# Patient Record
Sex: Male | Born: 1945 | Race: Black or African American | Hispanic: No | Marital: Married | Smoking: Former smoker
Health system: Southern US, Community
[De-identification: ages and names within clinical notes are randomized; demographics above are authoritative.]

## PROBLEM LIST (undated history)

## (undated) DIAGNOSIS — C801 Malignant (primary) neoplasm, unspecified: Secondary | ICD-10-CM

## (undated) DIAGNOSIS — I10 Essential (primary) hypertension: Secondary | ICD-10-CM

## (undated) DIAGNOSIS — R7303 Prediabetes: Secondary | ICD-10-CM

---
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
## 2001-03-11 ENCOUNTER — Emergency Department (HOSPITAL_COMMUNITY): Admission: EM | Admit: 2001-03-11 | Discharge: 2001-03-11 | Payer: Self-pay | Admitting: Emergency Medicine

## 2015-07-27 DIAGNOSIS — C801 Malignant (primary) neoplasm, unspecified: Secondary | ICD-10-CM

## 2015-07-27 HISTORY — DX: Malignant (primary) neoplasm, unspecified: C80.1

## 2015-07-27 HISTORY — PX: PROSTATECTOMY: SHX69

## 2019-01-01 ENCOUNTER — Other Ambulatory Visit: Payer: Self-pay | Admitting: Family Medicine

## 2019-01-01 DIAGNOSIS — K4090 Unilateral inguinal hernia, without obstruction or gangrene, not specified as recurrent: Secondary | ICD-10-CM

## 2019-01-04 ENCOUNTER — Ambulatory Visit
Admission: RE | Admit: 2019-01-04 | Discharge: 2019-01-04 | Disposition: A | Discharge status: Home / Self Care | Payer: 59 | Source: Ambulatory Visit | Attending: Family Medicine | Admitting: Family Medicine

## 2019-01-04 ENCOUNTER — Other Ambulatory Visit: Payer: Self-pay

## 2019-01-04 DIAGNOSIS — K4090 Unilateral inguinal hernia, without obstruction or gangrene, not specified as recurrent: Secondary | ICD-10-CM | POA: Diagnosis not present

## 2019-01-11 ENCOUNTER — Ambulatory Visit: Payer: Self-pay | Admitting: General Surgery

## 2019-01-11 NOTE — H&P (Signed)
PATIENT PROFILE: Martin Coleman is a 73 y.o. male who presents to the Clinic for consultation at the request of Dr. Baldemar Lenis for evaluation of inguinal hernia.  PCP:  Barnabas Lister, MD  HISTORY OF PRESENT ILLNESS: Martin Coleman reports patient reports that he has been feeling a left inguinal hernia since a month ago.  He reports minimal pain or discomfort but he refers that it has been growing significantly in the last 2 or 3 months.  The increasing side is what made him ask his primary care physician.  His PCP did a physical exam suspected the inguinal hernia but due to the obesity of the patient I ordered ultrasound and confirmed the left inguinal hernia.  Patient denies any problem on the right inguinal area.  There is no pain radiation.  There is no alleviating factor.  Aggravating factor of the hernia sites is bowel movement with straining.  Denies abdominal distention, nausea or vomiting.   PROBLEM LIST:         Problem List  Date Reviewed: 01/01/2019         Noted   Essential hypertension, benign 09/28/2018   Seasonal allergies 09/28/2018   Pure hypercholesterolemia 09/28/2018   Elevated blood sugar level, unspecified 09/28/2018   Essential tremor 09/28/2018      GENERAL REVIEW OF SYSTEMS:   General ROS: negative for - chills, fatigue, fever, weight gain or weight loss Allergy and Immunology ROS: negative for - hives  Hematological and Lymphatic ROS: negative for - bleeding problems or bruising, negative for palpable nodes Endocrine ROS: negative for - heat or cold intolerance, hair changes Respiratory ROS: negative for - cough, shortness of breath or wheezing Cardiovascular ROS: no chest pain or palpitations GI ROS: negative for nausea, vomiting, abdominal pain, diarrhea, constipation Musculoskeletal ROS: negative for - joint swelling or muscle pain Neurological ROS: negative for - confusion, syncope Dermatological ROS: negative for pruritus and rash Psychiatric:  negative for anxiety, depression, difficulty sleeping and memory loss  MEDICATIONS: CurrentMedications        Current Outpatient Medications  Medication Sig Dispense Refill  . atorvastatin (LIPITOR) 10 MG tablet Take 1 tablet (10 mg total) by mouth once daily 90 tablet 1  . olmesartan (BENICAR) 20 MG tablet Take 1 tablet (20 mg total) by mouth once daily 90 tablet 3  . propranoloL (INDERAL) 20 MG tablet Take 1 tablet (20 mg total) by mouth 2 (two) times daily 180 tablet 1   No current facility-administered medications for this visit.       ALLERGIES: Patient has no known allergies.  PAST MEDICAL HISTORY:     Past Medical History:  Diagnosis Date  . Allergy   . Arthritis   . Asthma, unspecified asthma severity, unspecified whether complicated, unspecified whether persistent   . Diabetes mellitus without complication (CMS-HCC) 07/02/72  . Glaucoma (increased eye pressure)   . History of cancer    prostate  . Hyperlipidemia   . Hypertension     PAST SURGICAL HISTORY:      Past Surgical History:  Procedure Laterality Date  . PROSTATE SURGERY  2017     FAMILY HISTORY: Family History  Problem Relation Age of Onset  . Hyperlipidemia (Elevated cholesterol) Mother   . Stroke Mother   . Myocardial Infarction (Heart attack) Mother   . Prostate cancer Father   . Diabetes type II Sister   . Prostate cancer Maternal Grandfather      SOCIAL HISTORY: Social History  Socioeconomic History  . Marital status: Married    Spouse name: Not on file  . Number of children: Not on file  . Years of education: Not on file  . Highest education level: Not on file  Occupational History  . Not on file  Social Needs  . Financial resource strain: Not on file  . Food insecurity:    Worry: Not on file    Inability: Not on file  . Transportation needs:    Medical: Not on file    Non-medical: Not on file  Tobacco Use  . Smoking status:  Former Smoker    Types: Cigarettes    Last attempt to quit: 1995    Years since quitting: 25.4  . Smokeless tobacco: Never Used  Substance and Sexual Activity  . Alcohol use: Yes  . Drug use: Never  . Sexual activity: Yes    Partners: Female  Other Topics Concern  . Not on file  Social History Narrative  . Not on file      PHYSICAL EXAM:    Vitals:   01/11/19 1625  BP: 133/70  Pulse: 55   Body mass index is 32.79 kg/m. Weight: 86.6 kg (191 lb)   GENERAL: Alert, active, oriented x3  HEENT: Pupils equal reactive to light. Extraocular movements are intact. Sclera clear. Palpebral conjunctiva normal red color.Pharynx clear.  NECK: Supple with no palpable mass and no adenopathy.  LUNGS: Sound clear with no rales rhonchi or wheezes.  HEART: Regular rhythm S1 and S2 without murmur.  ABDOMEN: Soft and depressible, nontender with no palpable mass, no hepatomegaly.  Left inguinal hernia, soft, reducible.  EXTREMITIES: Well-developed well-nourished symmetrical with no dependent edema.  NEUROLOGICAL: Awake alert oriented, facial expression symmetrical, moving all extremities.  REVIEW OF DATA: I have reviewed the following data today:      Ancillary Orders on 12/26/2018  Component Date Value  . Glucose 12/26/2018 107   . Sodium 12/26/2018 141   . Potassium 12/26/2018 5.3*  . Chloride 12/26/2018 103   . Carbon Dioxide (CO2) 12/26/2018 32.4*  . Urea Nitrogen (BUN) 12/26/2018 22   . Creatinine 12/26/2018 0.9   . Glomerular Filtration Ra* 12/26/2018 101   . Calcium 12/26/2018 9.6   . AST  12/26/2018 20   . ALT  12/26/2018 14   . Alk Phos (alkaline Phosp* 12/26/2018 47   . Albumin 12/26/2018 4.0   . Bilirubin, Total 12/26/2018 0.3   . Protein, Total 12/26/2018 6.8   . A/G Ratio 12/26/2018 1.4   . Hemoglobin A1C 12/26/2018 6.8*  . Average Blood Glucose (C* 12/26/2018 148   . Cholesterol, Total 12/26/2018 246*  . Triglyceride 12/26/2018 105   .  HDL (High Density Lipopr* 12/26/2018 48.7   . LDL Calculated 12/26/2018 176*  . VLDL Cholesterol 12/26/2018 21   . Cholesterol/HDL Ratio 12/26/2018 5.1   . Thyroid Stimulating Horm* 12/26/2018 2.773   . PSA (Prostate Specific A* 12/26/2018 <0.01*  . WBC (White Blood Cell Co* 12/26/2018 5.9   . RBC (Red Blood Cell Coun* 12/26/2018 4.51*  . Hemoglobin 12/26/2018 13.7*  . Hematocrit 12/26/2018 43.9   . MCV (Mean Corpuscular Vo* 12/26/2018 97.3   . MCH (Mean Corpuscular He* 12/26/2018 30.4   . MCHC (Mean Corpuscular H* 12/26/2018 31.2*  . Platelet Count 12/26/2018 251   . RDW-CV (Red Cell Distrib* 12/26/2018 14.6   . MPV (Mean Platelet Volum* 12/26/2018 10.3   . Neutrophils 12/26/2018 3.23   . Lymphocytes 12/26/2018 1.55   .  Monocytes 12/26/2018 0.70   . Eosinophils 12/26/2018 0.39   . Basophils 12/26/2018 0.06   . Neutrophil % 12/26/2018 54.3   . Lymphocyte % 12/26/2018 26.1   . Monocyte % 12/26/2018 11.8   . Eosinophil % 12/26/2018 6.6*  . Basophil% 12/26/2018 1.0   . Immature Granulocyte % 12/26/2018 0.2   . Immature Granulocyte Cou* 12/26/2018 0.01      ASSESSMENT: Mr. Ellner is a 73 y.o. male presenting for consultation for left inguinal hernia.    The patient presents with a symptomatic, reducible left inguinal hernia. Patient was oriented about the diagnosis of inguinal hernia and its implication. The patient was oriented about the treatment alternatives (observation vs surgical repair). Due to patient symptoms, repair is recommended. Patient oriented about the surgical procedure, the use of mesh and its risk of complications such as: infection, bleeding, injury to vas deference, vasculature and testicle, injury to bowel or bladder, and chronic pain.  Non-recurrent unilateral inguinal hernia without obstruction or gangrene [K40.90]  PLAN: 1.  Left inguinal hernia repair with mesh (30149) 2.  CBC and CMP done December 26, 2018 3.  Avoid taking aspirin 5 days before the  surgery 4.  Contact us to coordinate surgery date 5.  You can call the office at any time you have any question or concern.  Patient verbalized understanding, all questions were answered, and were agreeable with the plan outlined above.    Herbert Pun, MD  Electronically signed by Herbert Pun, MD

## 2019-01-20 ENCOUNTER — Other Ambulatory Visit: Payer: Self-pay | Admitting: Family Medicine

## 2019-01-20 DIAGNOSIS — Z20822 Contact with and (suspected) exposure to covid-19: Secondary | ICD-10-CM

## 2019-01-26 LAB — NOVEL CORONAVIRUS, NAA: SARS-CoV-2, NAA: NOT DETECTED

## 2019-02-05 ENCOUNTER — Other Ambulatory Visit: Payer: Self-pay

## 2019-02-05 ENCOUNTER — Encounter
Admission: RE | Admit: 2019-02-05 | Discharge: 2019-02-05 | Disposition: A | Discharge status: Home / Self Care | Payer: 59 | Source: Ambulatory Visit | Attending: General Surgery | Admitting: General Surgery

## 2019-02-05 DIAGNOSIS — Z1159 Encounter for screening for other viral diseases: Secondary | ICD-10-CM | POA: Diagnosis not present

## 2019-02-05 DIAGNOSIS — I10 Essential (primary) hypertension: Secondary | ICD-10-CM | POA: Diagnosis not present

## 2019-02-05 DIAGNOSIS — Z01818 Encounter for other preprocedural examination: Secondary | ICD-10-CM | POA: Insufficient documentation

## 2019-02-05 HISTORY — DX: Essential (primary) hypertension: I10

## 2019-02-05 HISTORY — DX: Prediabetes: R73.03

## 2019-02-05 NOTE — Patient Instructions (Signed)
  Your procedure is scheduled on: Monday February 12, 2019 Report to Same Day Surgery 2nd floor [facility] Entrance-take elevator on left to 2nd floor.  Check in with surgery information desk.) To find out your arrival time, call [phone] 1:00-3:00 PM on Friday February 09, 2019  Drive through [place] testing on Thursday February 08, 2019 8-10:30 am [place] entrance [address]  Remember: Instructions that are not followed completely may result in serious medical risk, up to and including death, or upon the discretion of your surgeon and anesthesiologist your surgery may need to be rescheduled.    __x__ 1. Do not eat food (including mints, candies, chewing gum) after midnight the night before your procedure. You may drink water up to 2 hours before you are scheduled to arrive at the hospital for your procedure.  Do not drink anything within 2 hours of your scheduled arrival to the hospital.    __x__ 2. No Alcohol for 24 hours before or after surgery.   __x__ 3. No Smoking or e-cigarettes for 24 hours before surgery.  Do not use any chewable tobacco products for at least 6 hours before surgery.   __x__ 4. Notify your doctor if there is any change in your medical condition (cold, fever, infections).   __x__ 5. On the morning of surgery brush your teeth with toothpaste and water.  You may rinse your mouth with mouthwash if you wish.  Do not swallow any toothpaste or mouthwash.  Please read over the following fact sheets that you were given:   [facility] Preparing for Surgery and/or MRSA Information    __x__ Use CHG Soap as directed on instruction sheet.   Do not wear jewelry, lotions, powders, deodorant, or perfumes on the day of surgery.  Do not shave below the face/neck 48 hours prior to surgery.   Do not bring valuables to the hospital.    [facility] is not responsible for any belongings or valuables.               Contacts, dentures or bridgework may  not be worn into surgery.  For patients discharged on the day of surgery, you will NOT be permitted to drive yourself home.  You must have a responsible adult with you for 24 hours after surgery.  __x__ Take these medicines on the morning of surgery with a SMALL SIP OF WATER:  1. Propranolol  2. Atorvastatin   Skip your Olmesartan only on the morning of surgery.  __x__ Follow recommendations from Cardiologist, Pulmonologist or PCP regarding stopping Aspirin, Coumadin, Plavix, Eliquis, Effient, Pradaxa, and Pletal.  __x__ TODAY: Do not take Anti-inflammatories such as Advil, Ibuprofen, Motrin, Aleve, Naproxen, Naprosyn, BC/Goodies powders or aspirin products. You may continue to take Tylenol.   __x__ TODAY: Do not take over the counter supplements until after surgery. You may continue to take Vitamin D, Vitamin B, and multivitamin.

## 2019-02-05 NOTE — Pre-Procedure Instructions (Signed)
Ekg/request for clearance called and faxed to dr Baldemar Lenis. Also called and faxed info to dr Peyton Najjar diaz

## 2019-02-08 ENCOUNTER — Other Ambulatory Visit
Admission: RE | Admit: 2019-02-08 | Discharge: 2019-02-08 | Disposition: A | Discharge status: Home / Self Care | Payer: 59 | Source: Ambulatory Visit | Attending: General Surgery | Admitting: General Surgery

## 2019-02-08 ENCOUNTER — Other Ambulatory Visit: Payer: Self-pay

## 2019-02-08 DIAGNOSIS — Z01818 Encounter for other preprocedural examination: Secondary | ICD-10-CM | POA: Diagnosis not present

## 2019-02-08 LAB — SARS CORONAVIRUS 2 (TAT 6-24 HRS): SARS Coronavirus 2: NEGATIVE

## 2019-02-09 ENCOUNTER — Encounter: Payer: Self-pay | Admitting: Anesthesiology

## 2019-02-11 MED ORDER — CEFAZOLIN SODIUM-DEXTROSE 2-4 GM/100ML-% IV SOLN
2.0000 g | INTRAVENOUS | Status: AC
  Administered 2019-02-12: 2 g via INTRAVENOUS

## 2019-02-12 ENCOUNTER — Other Ambulatory Visit: Payer: Self-pay

## 2019-02-12 ENCOUNTER — Encounter
Admission: RE | Disposition: A | Discharge status: Home / Self Care | Payer: Self-pay | Source: Home / Self Care | Attending: General Surgery

## 2019-02-12 ENCOUNTER — Ambulatory Visit: Payer: No Typology Code available for payment source | Admitting: Anesthesiology

## 2019-02-12 ENCOUNTER — Ambulatory Visit
Admission: RE | Admit: 2019-02-12 | Discharge: 2019-02-12 | Disposition: A | Discharge status: Home / Self Care | Payer: No Typology Code available for payment source | Attending: General Surgery | Admitting: General Surgery

## 2019-02-12 ENCOUNTER — Encounter: Payer: Self-pay | Admitting: *Deleted

## 2019-02-12 DIAGNOSIS — Z79899 Other long term (current) drug therapy: Secondary | ICD-10-CM | POA: Diagnosis not present

## 2019-02-12 DIAGNOSIS — E669 Obesity, unspecified: Secondary | ICD-10-CM | POA: Insufficient documentation

## 2019-02-12 DIAGNOSIS — E785 Hyperlipidemia, unspecified: Secondary | ICD-10-CM | POA: Insufficient documentation

## 2019-02-12 DIAGNOSIS — Z8546 Personal history of malignant neoplasm of prostate: Secondary | ICD-10-CM | POA: Diagnosis not present

## 2019-02-12 DIAGNOSIS — E7800 Pure hypercholesterolemia, unspecified: Secondary | ICD-10-CM | POA: Diagnosis not present

## 2019-02-12 DIAGNOSIS — Z6830 Body mass index (BMI) 30.0-30.9, adult: Secondary | ICD-10-CM | POA: Diagnosis not present

## 2019-02-12 DIAGNOSIS — Z87891 Personal history of nicotine dependence: Secondary | ICD-10-CM | POA: Diagnosis not present

## 2019-02-12 DIAGNOSIS — E1165 Type 2 diabetes mellitus with hyperglycemia: Secondary | ICD-10-CM | POA: Diagnosis not present

## 2019-02-12 DIAGNOSIS — K4090 Unilateral inguinal hernia, without obstruction or gangrene, not specified as recurrent: Secondary | ICD-10-CM | POA: Insufficient documentation

## 2019-02-12 DIAGNOSIS — I10 Essential (primary) hypertension: Secondary | ICD-10-CM | POA: Insufficient documentation

## 2019-02-12 HISTORY — PX: INGUINAL HERNIA REPAIR: SHX194

## 2019-02-12 LAB — GLUCOSE, CAPILLARY
Glucose-Capillary: 105 mg/dL — ABNORMAL HIGH (ref 70–99)
Glucose-Capillary: 123 mg/dL — ABNORMAL HIGH (ref 70–99)

## 2019-02-12 SURGERY — REPAIR, HERNIA, INGUINAL, ADULT
Anesthesia: General | Laterality: Left

## 2019-02-12 MED ORDER — ROCURONIUM BROMIDE 100 MG/10ML IV SOLN
INTRAVENOUS | Status: DC | PRN
  Administered 2019-02-12: 10 mg via INTRAVENOUS
  Administered 2019-02-12: 30 mg via INTRAVENOUS
  Administered 2019-02-12: 10 mg via INTRAVENOUS

## 2019-02-12 MED ORDER — HYDROCODONE-ACETAMINOPHEN 5-325 MG PO TABS: 1.0000 | ORAL_TABLET | ORAL | 0 refills | Status: AC | PRN

## 2019-02-12 MED ORDER — PROPOFOL 10 MG/ML IV BOLUS
INTRAVENOUS | Status: DC | PRN
  Administered 2019-02-12: 20 mg via INTRAVENOUS
  Administered 2019-02-12: 150 mg via INTRAVENOUS
  Administered 2019-02-12: 30 mg via INTRAVENOUS

## 2019-02-12 MED ORDER — LIDOCAINE HCL (CARDIAC) PF 100 MG/5ML IV SOSY
PREFILLED_SYRINGE | INTRAVENOUS | Status: DC | PRN
  Administered 2019-02-12: 80 mg via INTRAVENOUS

## 2019-02-12 MED ORDER — BUPIVACAINE-EPINEPHRINE (PF) 0.25% -1:200000 IJ SOLN
INTRAMUSCULAR | Status: AC
  Filled 2019-02-12: qty 30

## 2019-02-12 MED ORDER — EPHEDRINE SULFATE 50 MG/ML IJ SOLN
INTRAMUSCULAR | Status: DC | PRN
  Administered 2019-02-12 (×2): 10 mg via INTRAVENOUS

## 2019-02-12 MED ORDER — FENTANYL CITRATE (PF) 100 MCG/2ML IJ SOLN
INTRAMUSCULAR | Status: AC
  Filled 2019-02-12: qty 2

## 2019-02-12 MED ORDER — DEXAMETHASONE SODIUM PHOSPHATE 10 MG/ML IJ SOLN
INTRAMUSCULAR | Status: DC | PRN
  Administered 2019-02-12: 10 mg via INTRAVENOUS

## 2019-02-12 MED ORDER — PROPOFOL 10 MG/ML IV BOLUS
INTRAVENOUS | Status: AC
  Filled 2019-02-12: qty 20

## 2019-02-12 MED ORDER — FAMOTIDINE 20 MG PO TABS
20.0000 mg | ORAL_TABLET | Freq: Once | ORAL | Status: AC
  Administered 2019-02-12: 20 mg via ORAL

## 2019-02-12 MED ORDER — BUPIVACAINE-EPINEPHRINE 0.25% -1:200000 IJ SOLN
INTRAMUSCULAR | Status: DC | PRN
  Administered 2019-02-12: 30 mL

## 2019-02-12 MED ORDER — GLYCOPYRROLATE 0.2 MG/ML IJ SOLN
INTRAMUSCULAR | Status: DC | PRN
  Administered 2019-02-12: 0.2 mg via INTRAVENOUS

## 2019-02-12 MED ORDER — ONDANSETRON HCL 4 MG/2ML IJ SOLN: 4.0000 mg | Freq: Once | INTRAMUSCULAR | Status: DC | PRN

## 2019-02-12 MED ORDER — FENTANYL CITRATE (PF) 100 MCG/2ML IJ SOLN: 25.0000 ug | INTRAMUSCULAR | Status: DC | PRN

## 2019-02-12 MED ORDER — FAMOTIDINE 20 MG PO TABS
ORAL_TABLET | ORAL | Status: AC
  Administered 2019-02-12: 20 mg via ORAL
  Filled 2019-02-12: qty 1

## 2019-02-12 MED ORDER — ONDANSETRON HCL 4 MG/2ML IJ SOLN
INTRAMUSCULAR | Status: DC | PRN
  Administered 2019-02-12: 4 mg via INTRAVENOUS

## 2019-02-12 MED ORDER — CEFAZOLIN SODIUM-DEXTROSE 2-4 GM/100ML-% IV SOLN
INTRAVENOUS | Status: AC
  Filled 2019-02-12: qty 100

## 2019-02-12 MED ORDER — FENTANYL CITRATE (PF) 100 MCG/2ML IJ SOLN
INTRAMUSCULAR | Status: DC | PRN
  Administered 2019-02-12: 25 ug via INTRAVENOUS
  Administered 2019-02-12: 50 ug via INTRAVENOUS
  Administered 2019-02-12: 25 ug via INTRAVENOUS

## 2019-02-12 MED ORDER — LACTATED RINGERS IV SOLN
INTRAVENOUS | Status: DC | PRN
  Administered 2019-02-12 (×2): via INTRAVENOUS

## 2019-02-12 MED ORDER — SODIUM CHLORIDE 0.9 % IV SOLN
INTRAVENOUS | Status: DC
  Administered 2019-02-12: 07:00:00 via INTRAVENOUS

## 2019-02-12 MED ORDER — SUGAMMADEX SODIUM 200 MG/2ML IV SOLN
INTRAVENOUS | Status: DC | PRN
  Administered 2019-02-12: 200 mg via INTRAVENOUS

## 2019-02-12 MED ORDER — SEVOFLURANE IN SOLN
RESPIRATORY_TRACT | Status: AC
  Filled 2019-02-12: qty 250

## 2019-02-12 SURGICAL SUPPLY — 33 items
ADH SKN CLS APL DERMABOND .7 (GAUZE/BANDAGES/DRESSINGS) ×1
APL PRP STRL LF DISP 70% ISPRP (MISCELLANEOUS) ×1
BLADE SURG 15 STRL LF DISP TIS (BLADE) ×1 IMPLANT
BLADE SURG 15 STRL SS (BLADE) ×3
CANISTER SUCT 1200ML W/VALVE (MISCELLANEOUS) ×3 IMPLANT
CHLORAPREP W/TINT 26 (MISCELLANEOUS) ×3 IMPLANT
COVER WAND RF STERILE (DRAPES) ×3 IMPLANT
DERMABOND ADVANCED (GAUZE/BANDAGES/DRESSINGS) ×2
DERMABOND ADVANCED .7 DNX12 (GAUZE/BANDAGES/DRESSINGS) ×1 IMPLANT
DRAIN PENROSE 1/4X12 LTX (DRAIN) ×3 IMPLANT
DRAPE LAPAROTOMY 100X77 ABD (DRAPES) ×3 IMPLANT
ELECT REM PT RETURN 9FT ADLT (ELECTROSURGICAL) ×3
ELECTRODE REM PT RTRN 9FT ADLT (ELECTROSURGICAL) ×1 IMPLANT
GAUZE 4X4 16PLY RFD (DISPOSABLE) ×2 IMPLANT
GLOVE BIO SURGEON STRL SZ 6.5 (GLOVE) ×4 IMPLANT
GLOVE BIO SURGEONS STRL SZ 6.5 (GLOVE) ×2
GLOVE INDICATOR 6.5 STRL GRN (GLOVE) ×3 IMPLANT
GOWN STRL REUS W/ TWL LRG LVL3 (GOWN DISPOSABLE) ×2 IMPLANT
GOWN STRL REUS W/TWL LRG LVL3 (GOWN DISPOSABLE) ×6
LABEL OR SOLS (LABEL) ×3 IMPLANT
MESH HERNIA 6X13 (Mesh General) ×2 IMPLANT
NEEDLE HYPO 22GX1.5 SAFETY (NEEDLE) ×3 IMPLANT
NS IRRIG 500ML POUR BTL (IV SOLUTION) ×3 IMPLANT
PACK BASIN MINOR ARMC (MISCELLANEOUS) ×3 IMPLANT
SUT MNCRL 4-0 (SUTURE) ×3
SUT MNCRL 4-0 27XMFL (SUTURE) ×1
SUT SURGILON 0 BLK (SUTURE) ×8 IMPLANT
SUT VIC AB 2-0 BRD 54 (SUTURE) ×3 IMPLANT
SUT VIC AB 2-0 CT2 27 (SUTURE) ×3 IMPLANT
SUT VIC AB 3-0 SH 27 (SUTURE) ×6
SUT VIC AB 3-0 SH 27X BRD (SUTURE) ×2 IMPLANT
SUTURE MNCRL 4-0 27XMF (SUTURE) ×1 IMPLANT
SYR 10ML LL (SYRINGE) ×3 IMPLANT

## 2019-02-12 NOTE — Anesthesia Postprocedure Evaluation (Signed)
Anesthesia Post Note  Patient: Martin Coleman  Procedure(s) Performed: LEFT OPEN HERNIA REPAIR INGUINAL ADULT (Left )  Patient location during evaluation: PACU Anesthesia Type: General Level of consciousness: awake and alert Pain management: pain level controlled Vital Signs Assessment: post-procedure vital signs reviewed and stable Respiratory status: spontaneous breathing, nonlabored ventilation, respiratory function stable and patient connected to nasal cannula oxygen Cardiovascular status: blood pressure returned to baseline and stable Postop Assessment: no apparent nausea or vomiting Anesthetic complications: no     Last Vitals:  Vitals:   02/12/19 1123 02/12/19 1152  BP: (!) 153/62 (!) 156/73  Pulse: 60 (!) 55  Resp: 16 16  Temp: (!) 36 C   SpO2: 95% 95%    Last Pain:  Vitals:   02/12/19 1152  TempSrc:   PainSc: [place]

## 2019-02-12 NOTE — Op Note (Signed)
Preoperative diagnosis: Left Inguinal Hernia.  Postoperative diagnosis: Left Indirect Inguinal Hernia.  Procedure: Left Inguinal hernia repair with mesh  Anesthesia: General  Surgeon: Dr. Windell Moment  Wound Classification: Clean  Indications:  Patient is a 73 y.o. male developed a symptomatic left inguinal hernia. Repair was indicated to avoid complications of incarceration, obstruction and pain, and a prosthetic mesh repair was elected.  Findings: 1. Vas Deferens and cord structures identified and preserved 2. An indirect inguinal hernia was identified 3. Pre shaped Large Hernia System used for repair 4. Adequate hemostasis achieved  Description of procedure: The patient was taken to the operating room. A time-out was completed verifying correct patient, procedure, site, positioning, and implant(s) and/or special equipment prior to beginning this procedure. General anesthesia was induced. The left groin was prepped and draped in the usual sterile fashion. An incision was marked in a natural skin crease and planned to end near the pubic tubercle.  The skin crease incision was made with a knife and deepened through Scarpa's and Camper's fascia with electrocautery until the aponeurosis of the external oblique was encountered. This was cleaned and the external ring was exposed. Hemostasis was achieved in the wound. An incision was made in the midportion of the external oblique aponeurosis in the direction of its fibers. The ilioinguinal nerve was identified and protected throughout the dissection. Flaps of the external oblique were developed cephalad and inferiorly.  The cord was identified. It was gently dissected free at the pubic tubercle and encircled with a Penrose drain. Attention was directed to the anteromedial aspect of the cord, where an indirect hernia sac was identified. The sac was carefully dissected free of the cord down to the level of the internal ring. The vas and testicular  vessels were identified and protected from harm. The sac was opened and contents were reduced. A finger was passed into the peritoneal cavity and the floor of the inguinal canal assessed and found to be strong. The femoral canal was palpated and no hernia identified. The sac was twisted and suture ligated with 2-0 silk. Redundant sac was excised and submitted to pathology. The stump of the sac was checked for hemostasis and allowed to retract into the abdomen.  Attention then turned to the floor of the canal, which appeared to be grossly weakened without a well-defined defect or sac. The Pre shaped large Hernia System mesh was inserted. Beginning at the pubic tubercle, the mesh was sutured to the inguinal ligament inferiorly and the conjoint tendon superiorly using interrupted 0 nonabsorbable sutures. Care was taken to assure that the mesh was placed in a relaxed fashion to avoid excessive tension and that no neurovascular structures were caught in the repair. Laterally, the tails of the mesh were crossed and the internal ring recreated.  Hemostasis was again checked. The Penrose drain was removed. The external oblique aponeurosis was closed with a running suture of 3-0 Vicryl, taking care not to catch the ilioinguinal nerve in the suture line. Scarpa's fascia was closed with interrupted 3-0 Vicryl.  The skin was closed with a subcuticular stitch of Monocryl 4-0. Dermabond was applied.  The testis was gently pulled down into its anatomic position in the scrotum.  The patient tolerated the procedure well and was taken to the postanesthesia care unit in stable condition.   Specimen: Hernia sac and cord lipoma  Complications: None  Estimated Blood Loss: 20 mL

## 2019-02-12 NOTE — H&P (Signed)
PATIENT PROFILE: Martin Coleman is a 73 y.o. male who presents to the the OR repair of left inguinal hernia.  PCP: Barnabas Lister, MD  HISTORY OF PRESENT ILLNESS: Martin Coleman was initially evaluated in my office for evaluation of left inguinal hernia. He reports patient reports that he has been feeling a left inguinal hernia since a month ago. He reports minimal pain or discomfort but he refers that it has been growing significantly in the last 2 or 3 months. The increasing side is what made him ask his primary care physician. His PCP did a physical exam suspected the inguinal hernia but due to the obesity of the patient I ordered ultrasound and confirmed the left inguinal hernia. Patient denies any problem on the right inguinal area. There is no pain radiation. There is no alleviating factor. Aggravating factor of the hernia sites is bowel movement with straining. Denies abdominal distention, nausea or vomiting.  There has been no changes in his history or physical exam since last evaluation.   PROBLEM LIST: Problem List Date Reviewed: 01/01/2019  Noted  Essential hypertension, benign 09/28/2018  Seasonal allergies 09/28/2018  Pure hypercholesterolemia 09/28/2018  Elevated blood sugar level, unspecified 09/28/2018  Essential tremor 09/28/2018    GENERAL REVIEW OF SYSTEMS:   General ROS: negative for - chills, fatigue, fever, weight gain or weight loss Allergy and Immunology ROS: negative for - hives  Hematological and Lymphatic ROS: negative for - bleeding problems or bruising, negative for palpable nodes Endocrine ROS: negative for - heat or cold intolerance, hair changes Respiratory ROS: negative for - cough, shortness of breath or wheezing Cardiovascular ROS: no chest pain or palpitations GI ROS: negative for nausea, vomiting, abdominal pain, diarrhea, constipation Musculoskeletal ROS: negative for - joint swelling or muscle pain Neurological ROS: negative for - confusion,  syncope Dermatological ROS: negative for pruritus and rash Psychiatric: negative for anxiety, depression, difficulty sleeping and memory loss  MEDICATIONS: Current Outpatient Medications  Medication Sig Dispense Refill  . atorvastatin (LIPITOR) 10 MG tablet Take 1 tablet (10 mg total) by mouth once daily 90 tablet 1  . olmesartan (BENICAR) 20 MG tablet Take 1 tablet (20 mg total) by mouth once daily 90 tablet 3  . propranoloL (INDERAL) 20 MG tablet Take 1 tablet (20 mg total) by mouth 2 (two) times daily 180 tablet 1   No current facility-administered medications for this visit.   ALLERGIES: Patient has no known allergies.  PAST MEDICAL HISTORY: Past Medical History:  Diagnosis Date  . Allergy  . Arthritis  . Asthma, unspecified asthma severity, unspecified whether complicated, unspecified whether persistent  . Diabetes mellitus without complication (CMS-HCC) 93/7/34  . Glaucoma (increased eye pressure)  . History of cancer  prostate  . Hyperlipidemia  . Hypertension   PAST SURGICAL HISTORY: Past Surgical History:  Procedure Laterality Date  . PROSTATE SURGERY 2017    FAMILY HISTORY: Family History  Problem Relation Age of Onset  . Hyperlipidemia (Elevated cholesterol) Mother  . Stroke Mother  . Myocardial Infarction (Heart attack) Mother  . Prostate cancer Father  . Diabetes type II Sister  . Prostate cancer Maternal Grandfather    SOCIAL HISTORY: Social History   Socioeconomic History  . Marital status: Married  Spouse name: Not on file  . Number of children: Not on file  . Years of education: Not on file  . Highest education level: Not on file  Occupational History  . Not on file  Social Needs  . Financial resource strain: Not  on file  . Food insecurity:  Worry: Not on file  Inability: Not on file  . Transportation needs:  Medical: Not on file  Non-medical: Not on file  Tobacco Use  . Smoking status: Former Smoker  Types: Cigarettes  Last attempt  to quit: 1995  Years since quitting: 25.4  . Smokeless tobacco: Never Used  Substance and Sexual Activity  . Alcohol use: Yes  . Drug use: Never  . Sexual activity: Yes  Partners: Female  Other Topics Concern  . Not on file  Social History Narrative  . Not on file   PHYSICAL EXAM: Vitals:  01/11/19 1625  BP: 133/70  Pulse: 55   Body mass index is 32.79 kg/m. Weight: 86.6 kg (191 lb)   GENERAL: Alert, active, oriented x3  HEENT: Pupils equal reactive to light. Extraocular movements are intact. Sclera clear. Palpebral conjunctiva normal red color.Pharynx clear.  NECK: Supple with no palpable mass and no adenopathy.  LUNGS: Sound clear with no rales rhonchi or wheezes.  HEART: Regular rhythm S1 and S2 without murmur.  ABDOMEN: Soft and depressible, nontender with no palpable mass, no hepatomegaly. Left inguinal hernia, soft, reducible.  EXTREMITIES: Well-developed well-nourished symmetrical with no dependent edema.  NEUROLOGICAL: Awake alert oriented, facial expression symmetrical, moving all extremities.  REVIEW OF DATA: I have reviewed the following data today: Ancillary Orders on 12/26/2018  Component Date Value  . Glucose 12/26/2018 107  . Sodium 12/26/2018 141  . Potassium 12/26/2018 5.3*  . Chloride 12/26/2018 103  . Carbon Dioxide (CO2) 12/26/2018 32.4*  . Urea Nitrogen (BUN) 12/26/2018 22  . Creatinine 12/26/2018 0.9  . Glomerular Filtration Ra* 12/26/2018 101  . Calcium 12/26/2018 9.6  . AST 12/26/2018 20  . ALT 12/26/2018 14  . Alk Phos (alkaline Phosp* 12/26/2018 47  . Albumin 12/26/2018 4.0  . Bilirubin, Total 12/26/2018 0.3  . Protein, Total 12/26/2018 6.8  . A/G Ratio 12/26/2018 1.4  . Hemoglobin A1C 12/26/2018 6.8*  . Average Blood Glucose (C* 12/26/2018 148  . Cholesterol, Total 12/26/2018 246*  . Triglyceride 12/26/2018 105  . HDL (High Density Lipopr* 12/26/2018 48.7  . LDL Calculated 12/26/2018 176*  . VLDL Cholesterol 12/26/2018 21  .  Cholesterol/HDL Ratio 12/26/2018 5.1  . Thyroid Stimulating Horm* 12/26/2018 2.773  . PSA (Prostate Specific A* 12/26/2018 <0.01*  . WBC (White Blood Cell Co* 12/26/2018 5.9  . RBC (Red Blood Cell Coun* 12/26/2018 4.51*  . Hemoglobin 12/26/2018 13.7*  . Hematocrit 12/26/2018 43.9  . MCV (Mean Corpuscular Vo* 12/26/2018 97.3  . MCH (Mean Corpuscular He* 12/26/2018 30.4  . MCHC (Mean Corpuscular H* 12/26/2018 31.2*  . Platelet Count 12/26/2018 251  . RDW-CV (Red Cell Distrib* 12/26/2018 14.6  . MPV (Mean Platelet Volum* 12/26/2018 10.3  . Neutrophils 12/26/2018 3.23  . Lymphocytes 12/26/2018 1.55  . Monocytes 12/26/2018 0.70  . Eosinophils 12/26/2018 0.39  . Basophils 12/26/2018 0.06  . Neutrophil % 12/26/2018 54.3  . Lymphocyte % 12/26/2018 26.1  . Monocyte % 12/26/2018 11.8  . Eosinophil % 12/26/2018 6.6*  . Basophil% 12/26/2018 1.0  . Immature Granulocyte % 12/26/2018 0.2  . Immature Granulocyte Cou* 12/26/2018 0.01    ASSESSMENT: Martin Coleman is a 73 y.o. male presenting for consultation for left inguinal hernia.   The patient presents with a symptomatic, reducible left inguinal hernia. Patient was oriented about the diagnosis of inguinal hernia and its implication. The patient was oriented about the treatment alternatives (observation vs surgical repair). Due to patient symptoms, repair is  recommended. Patient oriented about the surgical procedure, the use of mesh and its risk of complications such as: infection, bleeding, injury to vas deference, vasculature and testicle, injury to bowel or bladder, and chronic pain.   Non-recurrent unilateral inguinal hernia without obstruction or gangrene [K40.90]  PLAN: 1. Left inguinal hernia repair with mesh (27871)   Patient verbalized understanding, all questions were answered, and were agreeable with the plan outlined above.

## 2019-02-12 NOTE — Anesthesia Preprocedure Evaluation (Addendum)
Anesthesia Evaluation  Patient identified by MRN, date of birth, ID band Patient awake    Reviewed: Allergy & Precautions, NPO status , Patient's Chart, lab work & pertinent test results, reviewed documented beta blocker date and time   Airway Mallampati: III  TM Distance: >3 FB     Dental  (+) Chipped, Upper Dentures, Partial Lower   Pulmonary former smoker,           Cardiovascular hypertension, Pt. on medications and Pt. on home beta blockers      Neuro/Psych    GI/Hepatic   Endo/Other    Renal/GU      Musculoskeletal   Abdominal   Peds  Hematology   Anesthesia Other Findings EKG shows Rbbb. Tends to be bradycardic.  Reproductive/Obstetrics                            Anesthesia Physical Anesthesia Plan  ASA: III  Anesthesia Plan: General   Post-op Pain Management:    Induction: Intravenous  PONV Risk Score and Plan:   Airway Management Planned: Oral ETT  Additional Equipment:   Intra-op Plan:   Post-operative Plan:   Informed Consent: I have reviewed the patients History and Physical, chart, labs and discussed the procedure including the risks, benefits and alternatives for the proposed anesthesia with the patient or authorized representative who has indicated his/her understanding and acceptance.       Plan Discussed with: CRNA  Anesthesia Plan Comments:         Anesthesia Quick Evaluation

## 2019-02-12 NOTE — Anesthesia Post-op Follow-up Note (Signed)
Anesthesia QCDR form completed.        

## 2019-02-12 NOTE — Transfer of Care (Signed)
Immediate Anesthesia Transfer of Care Note  Patient: Martin Coleman  Procedure(s) Performed: LEFT OPEN HERNIA REPAIR INGUINAL ADULT (Left )  Patient Location: PACU  Anesthesia Type:General  Level of Consciousness: awake, alert  and oriented  Airway & Oxygen Therapy: Patient Spontanous Breathing and Patient connected to face mask oxygen  Post-op Assessment: Report given to RN and Post -op Vital signs reviewed and stable  Post vital signs: Reviewed and stable  Last Vitals:  Vitals Value Taken Time  BP 144/69 02/12/19 1028  Temp    Pulse 59 02/12/19 1028  Resp 20 02/12/19 1028  SpO2 100 % 02/12/19 1028  Vitals shown include unvalidated device data.  Last Pain:  Vitals:   02/12/19 0617  TempSrc: Oral  PainSc: 0-No pain         Complications: No apparent anesthesia complications

## 2019-02-12 NOTE — Discharge Instructions (Addendum)
AMBULATORY SURGERY  DISCHARGE INSTRUCTIONS   1) The drugs that you were given will stay in your system until tomorrow so for the next 24 hours you should not:  A) Drive an automobile B) Make any legal decisions C) Drink any alcoholic beverage   2) You may resume regular meals tomorrow.  Today it is better to start with liquids and gradually work up to solid foods.  You may eat anything you prefer, but it is better to start with liquids, then soup and crackers, and gradually work up to solid foods.   3) Please notify your doctor immediately if you have any unusual bleeding, trouble breathing, redness and pain at the surgery site, drainage, fever, or pain not relieved by medication. 4)   5) Your post-operative visit with Dr.                                     is: Date:                        Time:    Please call to schedule your post-operative visit.  6) Additional Instructions:      Diet: Resume home heart healthy regular diet.   Activity: No heavy lifting >20 pounds (children, pets, laundry, garbage) or strenuous activity until follow-up, but light activity and walking are encouraged. Do not drive or drink alcohol if taking narcotic pain medications.  Wound care: May shower with soapy water and pat dry (do not rub incisions), but no baths or submerging incision underwater until follow-up. (no swimming)   Medications: Resume all home medications. For mild to moderate pain: acetaminophen (Tylenol) or ibuprofen (if no kidney disease). Combining Tylenol with alcohol can substantially increase your risk of causing liver disease. Narcotic pain medications, if prescribed, can be used for severe pain, though may cause nausea, constipation, and drowsiness. Do not combine Tylenol and Norco within a 6 hour period as Norco contains Tylenol. If you do not need the narcotic pain medication, you do not need to fill the prescription.  Call office [phone]) at any time if any questions,  worsening pain, fevers/chills, bleeding, drainage from incision site, or other concerns.      AMBULATORY SURGERY  DISCHARGE INSTRUCTIONS   7) The drugs that you were given will stay in your system until tomorrow so for the next 24 hours you should not:  D) Drive an automobile E) Make any legal decisions F) Drink any alcoholic beverage   8) You may resume regular meals tomorrow.  Today it is better to start with liquids and gradually work up to solid foods.  You may eat anything you prefer, but it is better to start with liquids, then soup and crackers, and gradually work up to solid foods.   9) Please notify your doctor immediately if you have any unusual bleeding, trouble breathing, redness and pain at the surgery site, drainage, fever, or pain not relieved by medication.    10) Additional Instructions:        Please contact your physician with any problems or Same Day Surgery at [phone], Monday through Friday 6 am to 4 pm, or [place] at [facility] number at [phone].

## 2019-02-12 NOTE — Anesthesia Procedure Notes (Signed)
Procedure Name: Intubation Date/Time: 02/12/2019 7:31 AM Performed by: Philbert Riser, CRNA Pre-anesthesia Checklist: Patient identified, Emergency Drugs available, Suction available, Patient being monitored and Timeout performed Patient Re-evaluated:Patient Re-evaluated prior to induction Oxygen Delivery Method: Circle system utilized and Simple face mask Preoxygenation: Pre-oxygenation with 100% oxygen Induction Type: IV induction Ventilation: Mask ventilation without difficulty Laryngoscope Size: Mac and 3 Grade View: Grade II Tube type: Oral Number of attempts: 1 Airway Equipment and Method: Stylet Placement Confirmation: ETT inserted through vocal cords under direct vision,  positive ETCO2 and breath sounds checked- equal and bilateral Secured at: 22 cm Tube secured with: Tape Dental Injury: Teeth and Oropharynx as per pre-operative assessment

## 2019-02-13 LAB — SURGICAL PATHOLOGY

## 2019-09-15 ENCOUNTER — Ambulatory Visit: Payer: Medicare Other | Attending: Internal Medicine

## 2019-09-15 ENCOUNTER — Other Ambulatory Visit: Payer: Self-pay

## 2019-09-15 DIAGNOSIS — Z23 Encounter for immunization: Secondary | ICD-10-CM | POA: Insufficient documentation

## 2019-09-15 NOTE — Progress Notes (Signed)
   Covid-19 Vaccination Clinic  Name:  Martin Coleman    MRN: [id number] DOB: November 10, 1945  09/15/2019  Mr. Mccullick was observed post Covid-19 immunization for 15 minutes without incidence. He was provided with Vaccine Information Sheet and instruction to access the V-Safe system.   Mr. Passon was instructed to call 911 with any severe reactions post vaccine: Marland Kitchen Difficulty breathing  . Swelling of your face and throat  . A fast heartbeat  . A bad rash all over your body  . Dizziness and weakness    Immunizations Administered    Name Date Dose VIS Date Route   Pfizer COVID-19 Vaccine 09/15/2019 10:53 AM 0.3 mL 07/06/2019 Intramuscular   Manufacturer: [place]   Lot: [id number]   [place]: [id number]

## 2019-10-10 ENCOUNTER — Ambulatory Visit: Payer: 59 | Attending: Internal Medicine

## 2019-10-10 DIAGNOSIS — Z23 Encounter for immunization: Secondary | ICD-10-CM

## 2019-10-10 NOTE — Progress Notes (Signed)
   Covid-19 Vaccination Clinic  Name:  Martin Coleman    MRN: [id number] DOB: 25-May-1946  10/10/2019  [name] was observed post Covid-19 immunization for 15 minutes without incident. He was provided with Vaccine Information Sheet and instruction to access the V-Safe system.   [name] was instructed to call 911 with any severe reactions post vaccine: Marland Kitchen Difficulty breathing  . Swelling of face and throat  . A fast heartbeat  . A bad rash all over body  . Dizziness and weakness   Immunizations Administered    Name Date Dose VIS Date Route   Pfizer COVID-19 Vaccine 10/10/2019  1:56 PM 0.3 mL 07/06/2019 Intramuscular   Manufacturer: [place]   Lot: [id number]   [place]: [id number]

## 2020-07-16 IMAGING — US LEFT LOWER EXTREMITY SOFT TISSUE ULTRASOUND LIMITED
1 series · 9 of 9 positions shown · non-contrast
Comparison: None.

CLINICAL DATA: Left groin pain and discomfort.  Assess for hernia.

EXAM:
ULTRASOUND left LOWER EXTREMITY LIMITED
TECHNIQUE: Ultrasound examination of the lower extremity soft tissues was
performed in the area of clinical concern.

[Series 1: left lower extremity soft tissue ultrasound limite · 9 acquisitions, 9 frames shown]
[im 1/9]
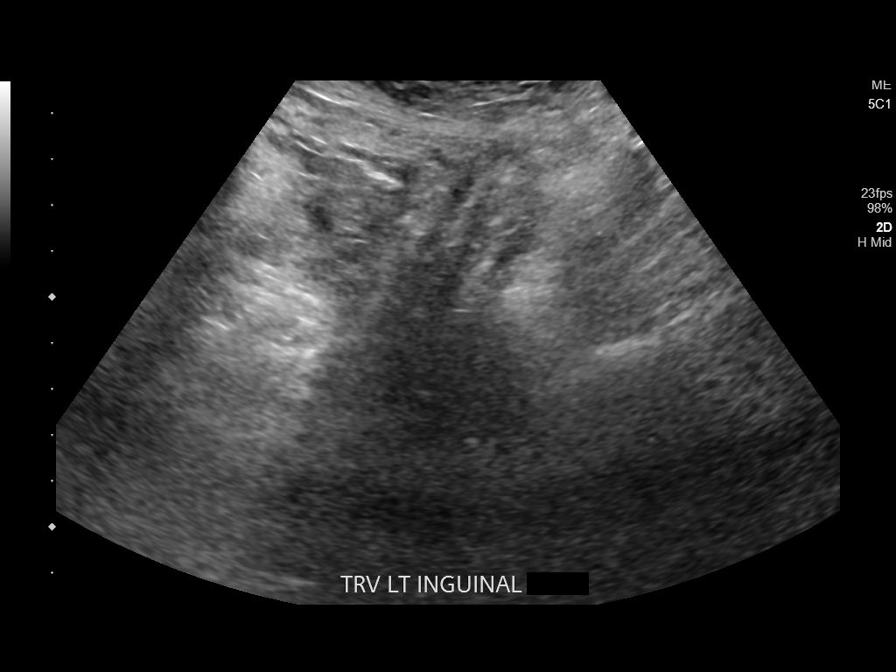
[im 2/9]
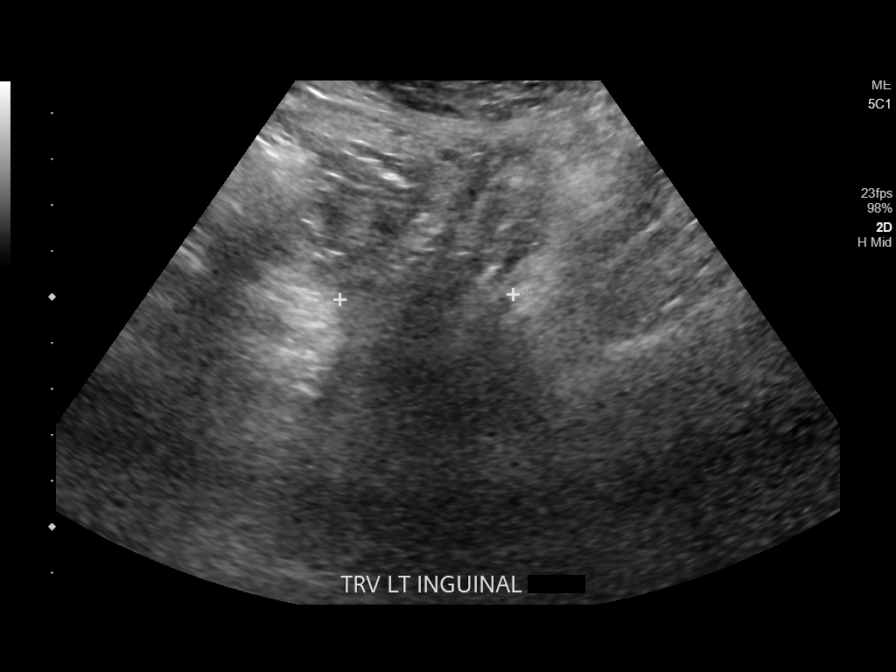
[im 3/9]
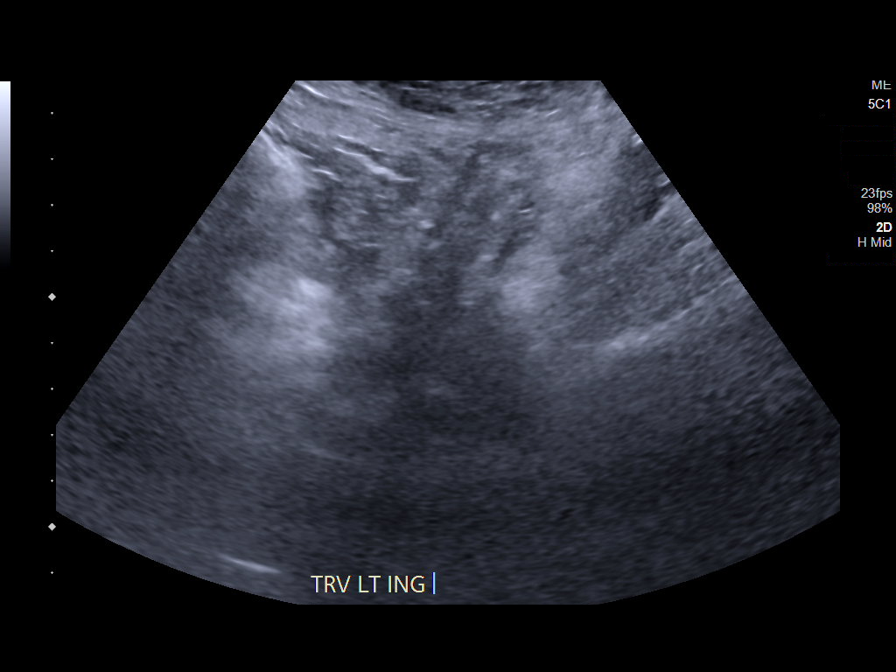
[im 4/9]
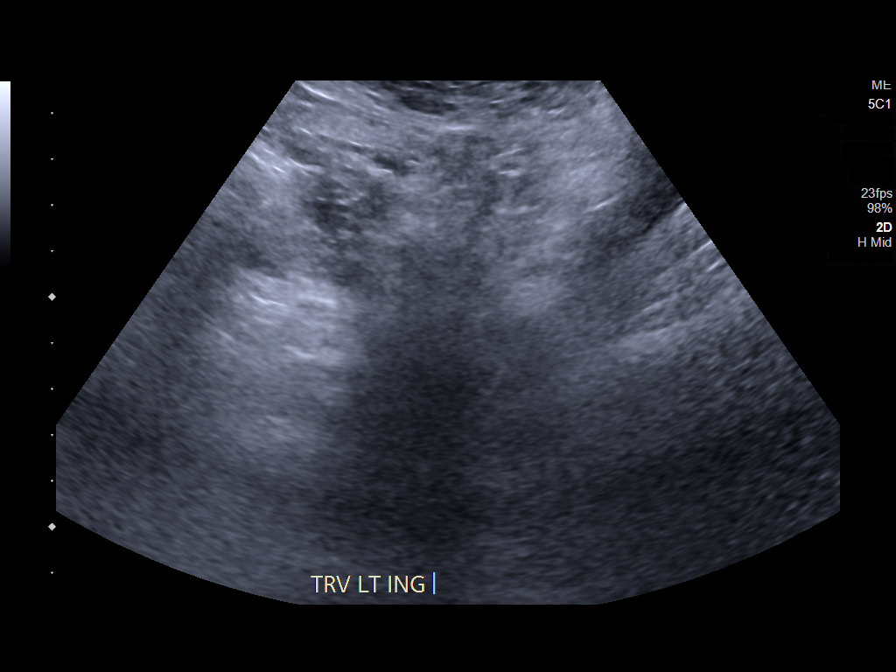
[im 5/9]
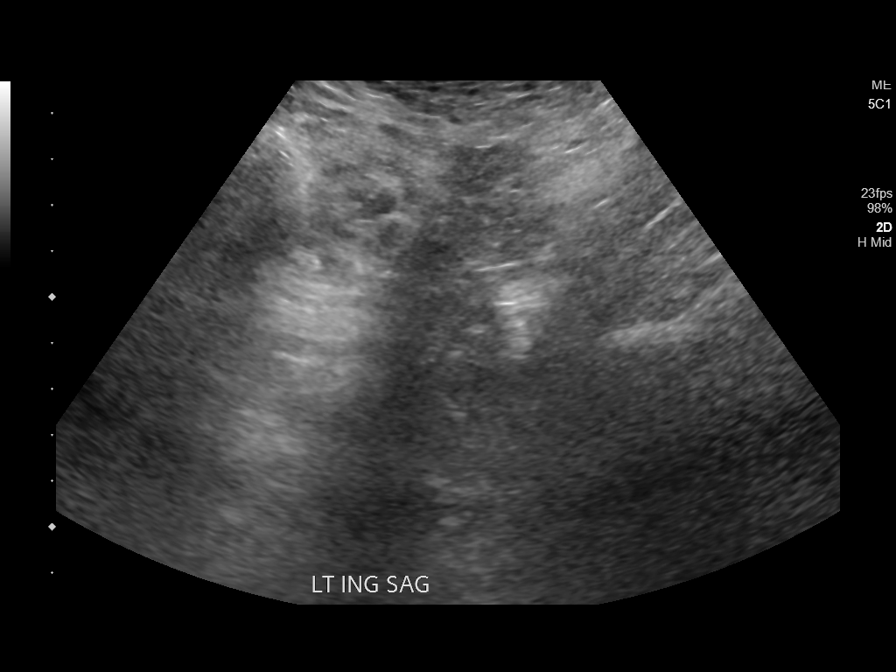
[im 6/9]
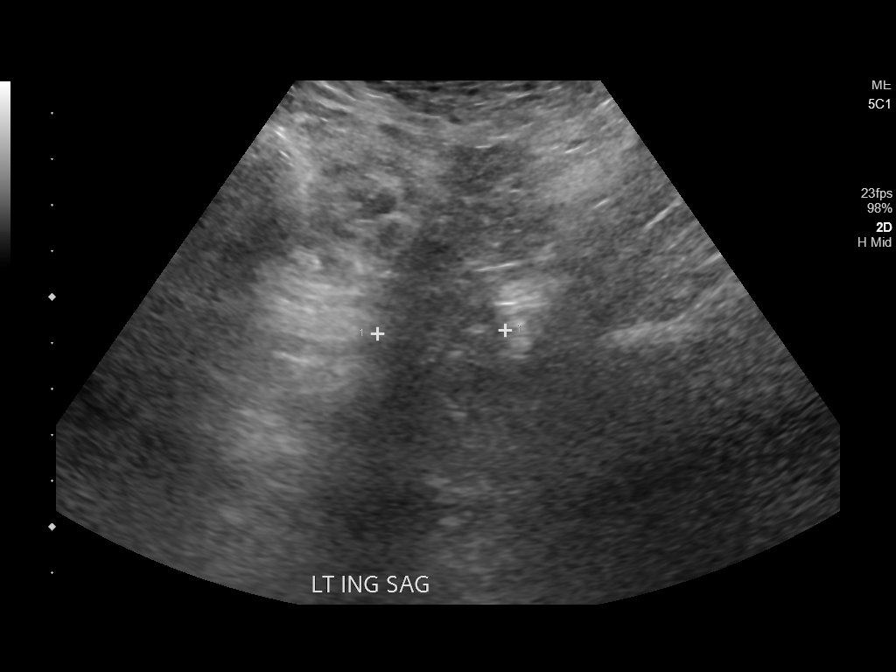
[im 7/9]
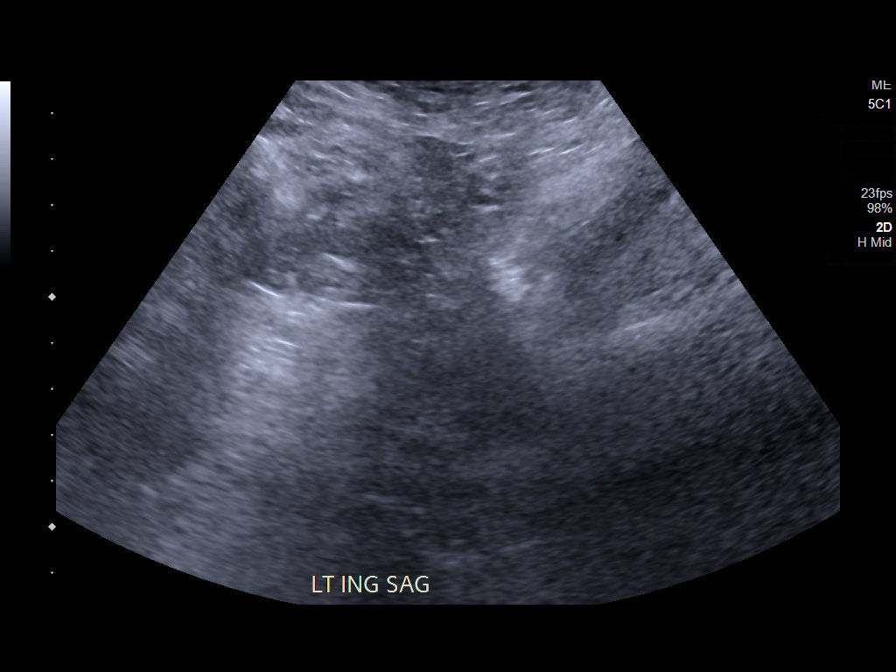
[im 8/9]
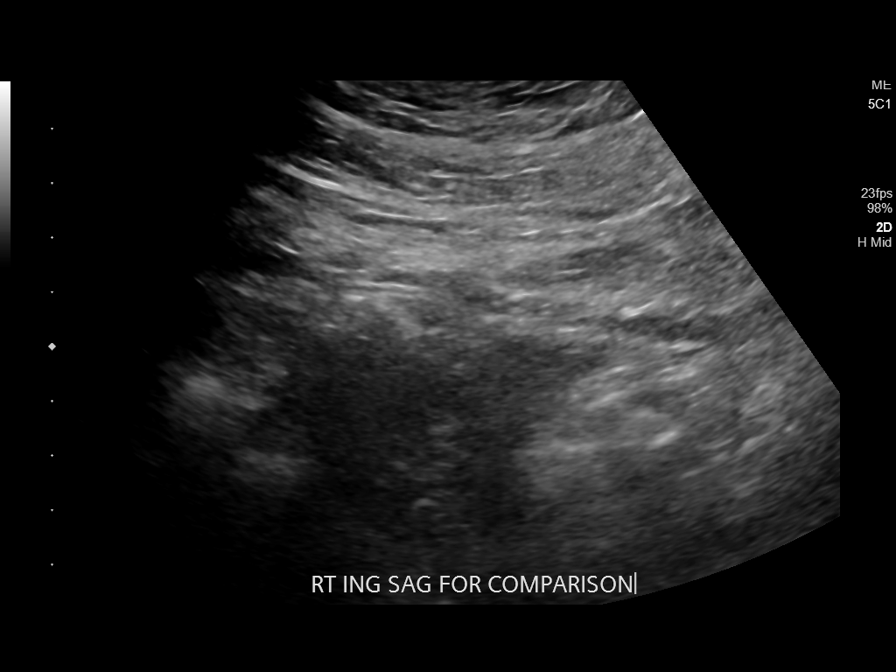
[im 9/9]
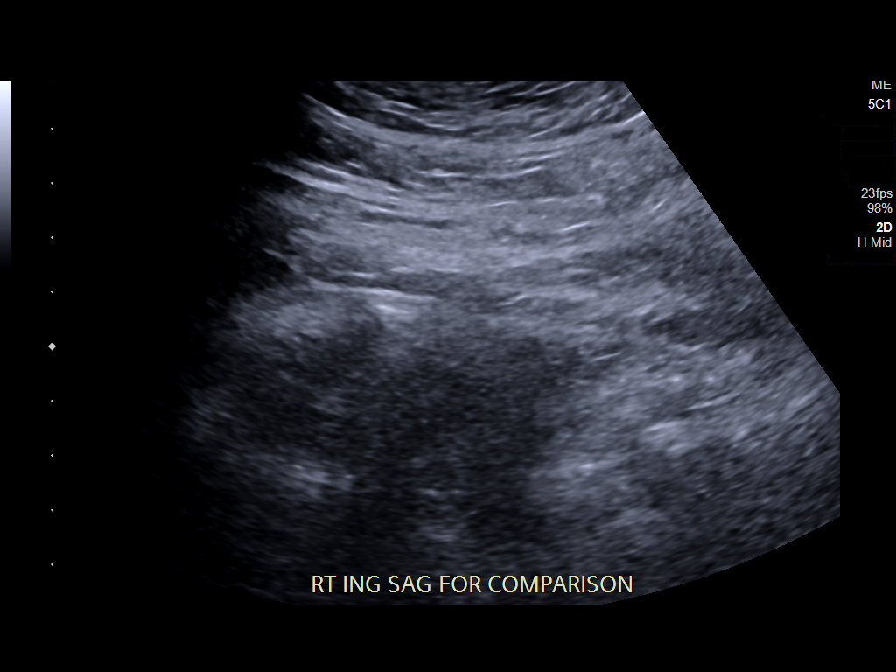

[9 of 9 positions shown; findings below may reference images not displayed]

FINDINGS: The study confirms the presence of a left inguinal hernia which
increases with Valsalva maneuver. No evidence of fluid or other
complicating feature.
IMPRESSION: Confirmation of a left inguinal hernia which increases with Valsalva
maneuver. No complicating feature.

## 2023-07-15 ENCOUNTER — Emergency Department
Admission: EM | Admit: 2023-07-15 | Discharge: 2023-07-15 | Disposition: A | Discharge status: Home / Self Care | Payer: Medicare Other | Attending: Emergency Medicine | Admitting: Emergency Medicine

## 2023-07-15 ENCOUNTER — Other Ambulatory Visit: Payer: Self-pay

## 2023-07-15 DIAGNOSIS — I10 Essential (primary) hypertension: Secondary | ICD-10-CM | POA: Insufficient documentation

## 2023-07-15 DIAGNOSIS — Z8546 Personal history of malignant neoplasm of prostate: Secondary | ICD-10-CM | POA: Diagnosis not present

## 2023-07-15 DIAGNOSIS — K644 Residual hemorrhoidal skin tags: Secondary | ICD-10-CM | POA: Diagnosis not present

## 2023-07-15 DIAGNOSIS — R001 Bradycardia, unspecified: Secondary | ICD-10-CM | POA: Diagnosis not present

## 2023-07-15 DIAGNOSIS — K625 Hemorrhage of anus and rectum: Secondary | ICD-10-CM

## 2023-07-15 LAB — CBC
HCT: 41.2 % (ref 39.0–52.0)
HCT: 44.4 % (ref 39.0–52.0)
Hemoglobin: 13.1 g/dL (ref 13.0–17.0)
Hemoglobin: 14.1 g/dL (ref 13.0–17.0)
MCH: 31.2 pg (ref 26.0–34.0)
MCH: 31.9 pg (ref 26.0–34.0)
MCHC: 31.8 g/dL (ref 30.0–36.0)
MCHC: 31.8 g/dL (ref 30.0–36.0)
MCV: 98.1 fL (ref 80.0–100.0)
MCV: 100.5 fL — ABNORMAL HIGH (ref 80.0–100.0)
Platelets: 207 10*3/uL (ref 150–400)
Platelets: 214 10*3/uL (ref 150–400)
RBC: 4.2 MIL/uL — ABNORMAL LOW (ref 4.22–5.81)
RBC: 4.42 MIL/uL (ref 4.22–5.81)
RDW: 14.3 % (ref 11.5–15.5)
RDW: 14.3 % (ref 11.5–15.5)
WBC: 6.7 10*3/uL (ref 4.0–10.5)
WBC: 7 10*3/uL (ref 4.0–10.5)
nRBC: 0 % (ref 0.0–0.2)
nRBC: 0 % (ref 0.0–0.2)

## 2023-07-15 LAB — COMPREHENSIVE METABOLIC PANEL
ALT: 16 U/L (ref 0–44)
AST: 25 U/L (ref 15–41)
Albumin: 4.2 g/dL (ref 3.5–5.0)
Alkaline Phosphatase: 38 U/L (ref 38–126)
Anion gap: 9 (ref 5–15)
BUN: 20 mg/dL (ref 8–23)
CO2: 29 mmol/L (ref 22–32)
Calcium: 9.4 mg/dL (ref 8.9–10.3)
Chloride: 101 mmol/L (ref 98–111)
Creatinine, Ser: 0.83 mg/dL (ref 0.61–1.24)
GFR, Estimated: >60 mL/min (ref >60)
Glucose, Bld: 112 mg/dL — ABNORMAL HIGH (ref 70–99)
Potassium: 4.4 mmol/L (ref 3.5–5.1)
Sodium: 139 mmol/L (ref 135–145)
Total Bilirubin: 0.6 mg/dL (ref <1.2)
Total Protein: 7.4 g/dL (ref 6.5–8.1)

## 2023-07-15 LAB — TYPE AND SCREEN
ABO/RH(D): A POS
Antibody Screen: NEGATIVE

## 2023-07-15 NOTE — ED Provider Notes (Signed)
[facility] Provider Note    Event Date/Time   First MD Initiated Contact with Patient 07/15/23 1703     (approximate)   History   Rectal Bleeding   HPI  Martin Coleman is a 77 year old male with history of HTN, prostate cancer s/p prostatectomy presenting to the emergency department for evaluation of rectal bleeding.  Over the past few days, patient has had some straining with his bowel movements.  Today, he had a bowel movement and when he wiped noticed a small amount of blood on the tissue, no blood noted in the bowl.  He wiped again and noticed slightly more blood.  He thought it might stop on his own, so he went to work, but later noticed that his brace felt wet and noticed blood that had saturated through part of his brief.  He presented to the walk-in clinic, but was directed to the ER for further evaluation.  Denies known history of hemorrhoids or prior GI bleed.  No abdominal pain, nausea, vomiting, diarrhea.     Physical Exam   Triage Vital Signs: ED Triage Vitals  Encounter Vitals Group     BP 07/15/23 1031 (!) 193/74     Systolic BP Percentile --      Diastolic BP Percentile --      Pulse Rate 07/15/23 1031 (!) 51     Resp 07/15/23 1031 19     Temp 07/15/23 1031 97.8 F (36.6 C)     Temp src --      SpO2 07/15/23 1031 98 %     Weight 07/15/23 1031 196 lb (88.9 kg)     Height 07/15/23 1031 5\' 7"  (1.702 m)     Head Circumference --      Peak Flow --      Pain Score 07/15/23 1034 0     Pain Loc --      Pain Education --      Exclude from Growth Chart --     Most recent vital signs: Vitals:   07/15/23 1031  BP: (!) 193/74  Pulse: (!) 51  Resp: 19  Temp: 97.8 F (36.6 C)  SpO2: 98%     General: Awake, interactive  CV:  Regular rate, good peripheral perfusion.  Resp:  Unlabored respirations. Abd:  Nondistended, soft, nontender to palpation, external hemorrhoids visualized, no active bleeding noted, brown stool on rectal exam,  faintly Hemoccult positive on moderate sized sample Neuro:  Symmetric facial movement, fluid speech   ED Results / Procedures / Treatments   Labs (all labs ordered are listed, but only abnormal results are displayed) Labs Reviewed  COMPREHENSIVE METABOLIC PANEL - Abnormal; Notable for the following components:      Result Value   Glucose, Bld 112 (*)    All other components within normal limits  CBC - Abnormal; Notable for the following components:   RBC 4.20 (*)    All other components within normal limits  CBC - Abnormal; Notable for the following components:   MCV 100.5 (*)    All other components within normal limits  POC OCCULT BLOOD, ED  TYPE AND SCREEN     EKG EKG independently reviewed interpreted by myself (ER attending) demonstrates:    RADIOLOGY Imaging independently reviewed and interpreted by myself demonstrates:    PROCEDURES:  Critical Care performed: No  Procedures   MEDICATIONS ORDERED IN ED: Medications - No data to display   IMPRESSION / MDM / ASSESSMENT AND PLAN / ED  COURSE  I reviewed the triage vital signs and the nursing notes.  Differential diagnosis includes, but is not limited to, rectal bleeding due to hemorrhoids, other anorectal pathology, consideration for diverticular bleed, other GI bleed  Patient's presentation is most consistent with acute presentation with potential threat to life or bodily function.  77 year old male presenting with rectal bleeding.  Hypertensive and bradycardic on presentation, blood pressure improved at the time of my initial evaluation.  No symptom suggestive of hypertensive emergency.  Labs reassuring including reassuring hemoglobin at 13.1.  Outside lab work from January of this year with hemoglobin of 12.9.  CMP reassuring, normal BUN.  [place] score of 8, consideration for discharge.  I did discuss the possibly of admission given his age and multiple episodes of rectal bleeding.  However, with clinical  history most suggestive of hemorrhoidal bleed, I did also discuss the possibility of discharge.  After discussion of risk and benefits, we will plan for repeat CBC as the patient has been in the waiting room for several hours since his initial draw.  If repeat hemoglobin is stable, patient would prefer to be discharged home with strict return precautions which I do think is reasonable.  Repeat hemoglobin remains reassuring at 14.1.  Patient reevaluated, remains comfortable with discharge home.  Strict return precautions provided.  Did discuss initiation of a bowel regimen for prevention of hemorrhoids in the future.  Patient discharged stable condition.      FINAL CLINICAL IMPRESSION(S) / ED DIAGNOSES   Final diagnoses:  Rectal bleeding     Rx / DC Orders   ED Discharge Orders     None        Note:  This document was prepared using Dragon voice recognition software and may include unintentional dictation errors.   Trinna Post, MD 07/15/23 [phone]

## 2023-07-15 NOTE — Discharge Instructions (Signed)
You were seen in the ER today for evaluation of your rectal bleeding.  I suspect that this is likely due to bleeding hemorrhoids.  Your blood test were overall reassuring.  I do recommend starting a bowel regimen to avoid straining.  I would start with 1 or 2 doses of MiraLAX every day.  If you are continuing to have constipation/straining, I recommend adding Dulcolax.  You can buy both of these over-the-counter.  If you have recurrent rectal bleeding, abdominal pain, or any other new or concerning symptoms, please return to the ER immediately.  Otherwise, follow with your primary care doctor for further evaluation.

## 2023-07-15 NOTE — ED Triage Notes (Addendum)
First nurse note: From [facility] for rectal bleeding and saturated through brief. Hx hemorrhoids and polyps. Pt denies pain.

## 2023-11-04 ENCOUNTER — Other Ambulatory Visit: Payer: Self-pay

## 2023-11-04 ENCOUNTER — Emergency Department
Admission: EM | Admit: 2023-11-04 | Discharge: 2023-11-04 | Disposition: A | Discharge status: Home / Self Care | Attending: Emergency Medicine | Admitting: Emergency Medicine

## 2023-11-04 DIAGNOSIS — R319 Hematuria, unspecified: Secondary | ICD-10-CM | POA: Diagnosis present

## 2023-11-04 DIAGNOSIS — Z79899 Other long term (current) drug therapy: Secondary | ICD-10-CM | POA: Diagnosis not present

## 2023-11-04 DIAGNOSIS — N3001 Acute cystitis with hematuria: Secondary | ICD-10-CM | POA: Insufficient documentation

## 2023-11-04 DIAGNOSIS — I10 Essential (primary) hypertension: Secondary | ICD-10-CM | POA: Insufficient documentation

## 2023-11-04 DIAGNOSIS — N390 Urinary tract infection, site not specified: Secondary | ICD-10-CM

## 2023-11-04 LAB — URINALYSIS, ROUTINE W REFLEX MICROSCOPIC
Bilirubin Urine: NEGATIVE
Glucose, UA: NEGATIVE mg/dL
Ketones, ur: NEGATIVE mg/dL
Nitrite: NEGATIVE
Protein, ur: 30 mg/dL — AB
RBC / HPF: >50 RBC/hpf (ref 0–5)
Specific Gravity, Urine: 1.016 (ref 1.005–1.030)
WBC, UA: >50 WBC/hpf (ref 0–5)
pH: 5 (ref 5.0–8.0)

## 2023-11-04 LAB — CBC
HCT: 39 % (ref 39.0–52.0)
Hemoglobin: 12.5 g/dL — ABNORMAL LOW (ref 13.0–17.0)
MCH: 31.9 pg (ref 26.0–34.0)
MCHC: 32.1 g/dL (ref 30.0–36.0)
MCV: 99.5 fL (ref 80.0–100.0)
Platelets: 221 10*3/uL (ref 150–400)
RBC: 3.92 MIL/uL — ABNORMAL LOW (ref 4.22–5.81)
RDW: 14.3 % (ref 11.5–15.5)
WBC: 7.5 10*3/uL (ref 4.0–10.5)
nRBC: 0 % (ref 0.0–0.2)

## 2023-11-04 LAB — BASIC METABOLIC PANEL WITH GFR
Anion gap: 9 (ref 5–15)
BUN: 17 mg/dL (ref 8–23)
CO2: 28 mmol/L (ref 22–32)
Calcium: 8.9 mg/dL (ref 8.9–10.3)
Chloride: 104 mmol/L (ref 98–111)
Creatinine, Ser: 0.9 mg/dL (ref 0.61–1.24)
GFR, Estimated: >60 mL/min (ref >60)
Glucose, Bld: 100 mg/dL — ABNORMAL HIGH (ref 70–99)
Potassium: 4 mmol/L (ref 3.5–5.1)
Sodium: 141 mmol/L (ref 135–145)

## 2023-11-04 MED ORDER — CEPHALEXIN 500 MG PO CAPS: 500.0000 mg | ORAL_CAPSULE | Freq: Three times a day (TID) | ORAL | 0 refills | Status: AC

## 2023-11-04 MED ORDER — SODIUM CHLORIDE 0.9 % IV SOLN
1.0000 g | Freq: Once | INTRAVENOUS | Status: AC
  Administered 2023-11-04: 1 g via INTRAVENOUS
  Filled 2023-11-04: qty 10

## 2023-11-04 NOTE — ED Notes (Addendum)
 Pt alert, NAD, calm, interactive, resps e/u, speaking in clear complete sentences, skin W&D. States, "Feel OK", denies pain, sob, nausea, visual changes, HA, dizziness or other sx. VSS. BP mildly elevated.

## 2023-11-04 NOTE — ED Triage Notes (Signed)
 Pt to ED from [facility] for c/o HTN and hematuria. BP 200/74 at [facility]. Pt ambulatory to triage. Denies chest pain, sob, abd pain. Took daily bp meds.

## 2023-11-04 NOTE — ED Provider Notes (Signed)
 [facility] Provider Note    Event Date/Time   First MD Initiated Contact with Patient 11/04/23 1503     (approximate)   History   Hematuria   HPI  Martin Coleman is a 78 y.o. male who presents to the emergency department today from walk-in clinic because of concerns for high blood pressure.  However the patient went to walk-in clinic today because of concerns for hematuria.  Patient noticed red blood in his urine yesterday.  Then this morning he noticed some brown urine although he says his most recent urination has looked clear to him.  The patient did have some burning after 1 urination although has not had any other pain or bad odor to his urine.  When he went to urgent care to be evaluated for this they found his blood pressure was high so sent him to the emergency department.  Does have a history of high blood pressure and he is on medication.      Physical Exam   Triage Vital Signs: ED Triage Vitals  Encounter Vitals Group     BP 11/04/23 1435 (!) 196/76     Systolic BP Percentile --      Diastolic BP Percentile --      Pulse Rate 11/04/23 1435 63     Resp 11/04/23 1435 16     Temp 11/04/23 1435 98 F (36.7 C)     Temp Source 11/04/23 1435 Oral     SpO2 11/04/23 1435 99 %     Weight 11/04/23 1436 197 lb (89.4 kg)     Height 11/04/23 1436 5\' 7"  (1.702 m)     Head Circumference --      Peak Flow --      Pain Score 11/04/23 1436 0     Pain Loc --      Pain Education --      Exclude from Growth Chart --     Most recent vital signs: Vitals:   11/04/23 1435  BP: (!) 196/76  Pulse: 63  Resp: 16  Temp: 98 F (36.7 C)  SpO2: 99%   General: Awake, alert, oriented. CV:  Good peripheral perfusion. Regular rate and rhythm. Resp:  Normal effort. Lungs clear. Abd:  No distention. Non tender.  ED Results / Procedures / Treatments   Labs (all labs ordered are listed, but only abnormal results are displayed) Labs Reviewed  CBC - Abnormal;  Notable for the following components:      Result Value   RBC 3.92 (*)    Hemoglobin 12.5 (*)    All other components within normal limits  BASIC METABOLIC PANEL WITH GFR  URINALYSIS, ROUTINE W REFLEX MICROSCOPIC  CBG MONITORING, ED     EKG  I, Phineas Semen, attending physician, personally viewed and interpreted this EKG  EKG Time: 1439 Rate: 61 Rhythm: normal sinus rhythm Axis: left axis deviation Intervals: qtc 471 QRS: RBBB, LAFB ST changes: no st elevation Impression: abnormal ekg   RADIOLOGY None   PROCEDURES:  Critical Care performed: No    MEDICATIONS ORDERED IN ED: Medications - No data to display   IMPRESSION / MDM / ASSESSMENT AND PLAN / ED COURSE  I reviewed the triage vital signs and the nursing notes.                              Differential diagnosis includes, but is not limited to, UTI, neoplasm, kidney  stone  Patient's presentation is most consistent with acute presentation with potential threat to life or bodily function.  Patient presents to the emergency department today with primary complaint of hematuria.  UA is concerning for UTI.  I discussed this with the patient.  I do think likely this is the cause of the patient's hematuria.  Did however discussed importance of following up with primary care for repeat UA after course of antibiotics.  Patient was sent to the emergency department today because of high blood pressure.  Patient's blood pressure was downtrending here in the emergency department.  Discussed with patient that he should continue follow-up with primary care for blood pressure control.      FINAL CLINICAL IMPRESSION(S) / ED DIAGNOSES   Final diagnoses:  Hematuria, unspecified type  Lower urinary tract infectious disease       Note:  This document was prepared using Dragon voice recognition software and may include unintentional dictation errors.    Phineas Semen, MD 11/04/23 [phone]

## 2023-11-06 LAB — URINE CULTURE: Culture: >=100000 — AB
